# Patient Record
Sex: Male | Born: 1985 | Race: White | Hispanic: No | Marital: Married | Smoking: Current every day smoker
Health system: Southern US, Community
[De-identification: ages and names within clinical notes are randomized; demographics above are authoritative.]

---
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
## 2003-07-23 ENCOUNTER — Emergency Department (HOSPITAL_COMMUNITY): Admission: EM | Admit: 2003-07-23 | Discharge: 2003-07-23 | Payer: Self-pay | Admitting: Emergency Medicine

## 2004-01-07 ENCOUNTER — Emergency Department (HOSPITAL_COMMUNITY): Admission: EM | Admit: 2004-01-07 | Discharge: 2004-01-07 | Payer: Self-pay | Admitting: Emergency Medicine

## 2005-01-12 ENCOUNTER — Emergency Department (HOSPITAL_COMMUNITY): Admission: EM | Admit: 2005-01-12 | Discharge: 2005-01-12 | Payer: Self-pay | Admitting: Emergency Medicine

## 2008-09-27 ENCOUNTER — Emergency Department (HOSPITAL_BASED_OUTPATIENT_CLINIC_OR_DEPARTMENT_OTHER): Admission: EM | Admit: 2008-09-27 | Discharge: 2008-09-27 | Payer: Self-pay | Admitting: Emergency Medicine

## 2009-06-03 ENCOUNTER — Encounter: Admission: RE | Admit: 2009-06-03 | Discharge: 2009-06-03 | Payer: Self-pay | Admitting: Occupational Medicine

## 2009-07-17 ENCOUNTER — Emergency Department (HOSPITAL_COMMUNITY): Admission: EM | Admit: 2009-07-17 | Discharge: 2009-07-17 | Payer: Self-pay | Admitting: Emergency Medicine

## 2009-10-17 ENCOUNTER — Ambulatory Visit: Payer: Self-pay | Admitting: Diagnostic Radiology

## 2009-10-17 ENCOUNTER — Emergency Department (HOSPITAL_BASED_OUTPATIENT_CLINIC_OR_DEPARTMENT_OTHER)
Admission: EM | Admit: 2009-10-17 | Discharge: 2009-10-17 | Payer: Self-pay | Source: Home / Self Care | Admitting: Emergency Medicine

## 2010-11-24 ENCOUNTER — Emergency Department (HOSPITAL_COMMUNITY)
Admission: EM | Admit: 2010-11-24 | Discharge: 2010-11-24 | Disposition: A | Discharge status: Home / Self Care | Payer: BC Managed Care – PPO | Attending: Emergency Medicine | Admitting: Emergency Medicine

## 2010-11-24 DIAGNOSIS — R000 Tachycardia, unspecified: Secondary | ICD-10-CM | POA: Insufficient documentation

## 2010-11-24 DIAGNOSIS — F411 Generalized anxiety disorder: Secondary | ICD-10-CM | POA: Insufficient documentation

## 2010-11-24 DIAGNOSIS — F341 Dysthymic disorder: Secondary | ICD-10-CM | POA: Insufficient documentation

## 2010-11-24 DIAGNOSIS — R064 Hyperventilation: Secondary | ICD-10-CM | POA: Insufficient documentation

## 2010-11-24 DIAGNOSIS — Z79899 Other long term (current) drug therapy: Secondary | ICD-10-CM | POA: Insufficient documentation

## 2010-11-24 DIAGNOSIS — R079 Chest pain, unspecified: Secondary | ICD-10-CM | POA: Insufficient documentation

## 2010-11-24 DIAGNOSIS — R002 Palpitations: Secondary | ICD-10-CM | POA: Insufficient documentation

## 2010-11-24 DIAGNOSIS — R209 Unspecified disturbances of skin sensation: Secondary | ICD-10-CM | POA: Insufficient documentation

## 2010-11-24 DIAGNOSIS — F410 Panic disorder [episodic paroxysmal anxiety] without agoraphobia: Secondary | ICD-10-CM | POA: Insufficient documentation

## 2010-12-04 ENCOUNTER — Emergency Department (HOSPITAL_COMMUNITY)
Admission: EM | Admit: 2010-12-04 | Discharge: 2010-12-04 | Disposition: A | Discharge status: Home / Self Care | Payer: BC Managed Care – PPO | Attending: Emergency Medicine | Admitting: Emergency Medicine

## 2010-12-04 DIAGNOSIS — F411 Generalized anxiety disorder: Secondary | ICD-10-CM | POA: Insufficient documentation

## 2010-12-04 DIAGNOSIS — F410 Panic disorder [episodic paroxysmal anxiety] without agoraphobia: Secondary | ICD-10-CM | POA: Insufficient documentation

## 2010-12-04 DIAGNOSIS — Z79899 Other long term (current) drug therapy: Secondary | ICD-10-CM | POA: Insufficient documentation

## 2010-12-04 DIAGNOSIS — F3289 Other specified depressive episodes: Secondary | ICD-10-CM | POA: Insufficient documentation

## 2010-12-04 DIAGNOSIS — F329 Major depressive disorder, single episode, unspecified: Secondary | ICD-10-CM | POA: Insufficient documentation

## 2010-12-04 DIAGNOSIS — R209 Unspecified disturbances of skin sensation: Secondary | ICD-10-CM | POA: Insufficient documentation

## 2013-08-12 ENCOUNTER — Emergency Department (HOSPITAL_COMMUNITY)
Admission: EM | Admit: 2013-08-12 | Discharge: 2013-08-12 | Disposition: A | Discharge status: Home / Self Care | Payer: BC Managed Care – PPO | Attending: Emergency Medicine | Admitting: Emergency Medicine

## 2013-08-12 ENCOUNTER — Encounter (HOSPITAL_COMMUNITY): Payer: Self-pay | Admitting: Emergency Medicine

## 2013-08-12 ENCOUNTER — Emergency Department (HOSPITAL_COMMUNITY): Payer: BC Managed Care – PPO

## 2013-08-12 DIAGNOSIS — Y929 Unspecified place or not applicable: Secondary | ICD-10-CM | POA: Insufficient documentation

## 2013-08-12 DIAGNOSIS — M940 Chondrocostal junction syndrome [Tietze]: Secondary | ICD-10-CM | POA: Insufficient documentation

## 2013-08-12 DIAGNOSIS — Y9389 Activity, other specified: Secondary | ICD-10-CM | POA: Insufficient documentation

## 2013-08-12 DIAGNOSIS — Z880 Allergy status to penicillin: Secondary | ICD-10-CM | POA: Insufficient documentation

## 2013-08-12 DIAGNOSIS — F17200 Nicotine dependence, unspecified, uncomplicated: Secondary | ICD-10-CM | POA: Insufficient documentation

## 2013-08-12 DIAGNOSIS — IMO0002 Reserved for concepts with insufficient information to code with codable children: Secondary | ICD-10-CM | POA: Insufficient documentation

## 2013-08-12 MED ORDER — OXYCODONE-ACETAMINOPHEN 5-325 MG PO TABS
2.0000 | ORAL_TABLET | Freq: Once | ORAL | Status: AC
  Administered 2013-08-12: 2 via ORAL
  Filled 2013-08-12: qty 2

## 2013-08-12 MED ORDER — TRAMADOL HCL 50 MG PO TABS: 50.0000 mg | ORAL_TABLET | Freq: Four times a day (QID) | ORAL | Status: AC | PRN

## 2013-08-12 NOTE — Discharge Instructions (Signed)
Costochondritis Costochondritis, sometimes called Tietze syndrome, is a swelling and irritation (inflammation) of the tissue (cartilage) that connects your ribs with your breastbone (sternum). It causes pain in the chest and rib area. Costochondritis usually goes away on its own over time. It can take up to 6 weeks or longer to get better, especially if you are unable to limit your activities. CAUSES  Some cases of costochondritis have no known cause. Possible causes include:  Injury (trauma).  Exercise or activity such as lifting.  Severe coughing. SIGNS AND SYMPTOMS  Pain and tenderness in the chest and rib area.  Pain that gets worse when coughing or taking deep breaths.  Pain that gets worse with specific movements. DIAGNOSIS  Your health care provider will do a physical exam and ask about your symptoms. Chest X-rays or other tests may be done to rule out other problems. TREATMENT  Costochondritis usually goes away on its own over time. Your health care provider may prescribe medicine to help relieve pain. HOME CARE INSTRUCTIONS   Avoid exhausting physical activity. Try not to strain your ribs during normal activity. This would include any activities using chest, abdominal, and side muscles, especially if heavy weights are used.  Apply ice to the affected area for the first 2 days after the pain begins.  Put ice in a plastic bag.  Place a towel between your skin and the bag.  Leave the ice on for 20 minutes, 2-3 times a day.  Only take over-the-counter or prescription medicines as directed by your health care provider. SEEK MEDICAL CARE IF:  You have redness or swelling at the rib joints. These are signs of infection.  Your pain does not go away despite rest or medicine. SEEK IMMEDIATE MEDICAL CARE IF:   Your pain increases or you are very uncomfortable.  You have shortness of breath or difficulty breathing.  You cough up blood.  You have worse chest pains,  sweating, or vomiting.  You have a fever or persistent symptoms for more than 2-3 days.  You have a fever and your symptoms suddenly get worse. MAKE SURE YOU:   Understand these instructions.  Will watch your condition.  Will get help right away if you are not doing well or get worse. Document Released: 11/15/2004 Document Revised: 11/26/2012 Document Reviewed: 09/09/2012 Exit[employer] Patient Information 2015 [employer]. This information is not intended to replace advice given to you by your health care provider. Make sure you discuss any questions you have with your health care provider.  

## 2013-08-12 NOTE — ED Notes (Signed)
Patient is alert and oriented x3.  He was given DC instructions and follow up visit instructions.  Patient gave verbal understanding.  He was DC ambulatory under his own power to home.  V/S stable.  He was not showing any signs of distress on DC 

## 2013-08-12 NOTE — ED Notes (Signed)
Per pt report: Pt was playing with daughter on Sunday when his daughter jumped on pt's right upper chest.  Pt heard a "pop" and has been trying to deal with the pain.  Pt reports it feels like someone is stabbing into him.  No obvious deformity or crepitus noted.  Area feels softer upon palpation and pt reports increasing pain.  Pt a/o x 4.  Pt ambulatory.

## 2013-08-12 NOTE — ED Provider Notes (Signed)
CSN: [id number]     Arrival date & time 08/12/13  0507 History   First MD Initiated Contact with Patient 08/12/13 0602     Chief Complaint  Patient presents with  . Rib Injury     (Consider location/radiation/quality/duration/timing/severity/associated sxs/prior Treatment) Patient is a 28 y.o. male presenting with chest pain. The history is provided by the patient. No language interpreter was used.  Chest Pain Pain location:  R chest Pain quality: sharp   Pain radiates to:  Does not radiate Pain radiates to the back: no   Pain severity:  Moderate Onset quality:  Gradual Duration:  2 days Timing:  Intermittent Progression:  Unchanged Chronicity:  New Context: breathing and movement   Context: no stress   Relieved by:  Nothing Worsened by:  Deep breathing and movement Ineffective treatments:  Aspirin Associated symptoms: no abdominal pain, no cough, no fever, no numbness and no shortness of breath     28 year old male presents complaining of rib injury.  History reviewed. No pertinent past medical history. No past surgical history on file. No family history on file. History  Substance Use Topics  . Smoking status: Current Every Day Smoker -- 1.00 packs/day    Types: Cigarettes  . Smokeless tobacco: Not on file  . Alcohol Use: No    Review of Systems  Constitutional: Negative for fever.  Respiratory: Negative for cough and shortness of breath.   Cardiovascular: Positive for chest pain.  Gastrointestinal: Negative for abdominal pain.  Skin: Negative for rash and wound.  Neurological: Negative for numbness.      Allergies  Amoxicillin  Home Medications   Prior to Admission medications   Medication Sig Start Date End Date Taking? Authorizing Provider  naproxen sodium (ANAPROX) 220 MG tablet Take 440 mg by mouth 2 (two) times daily as needed (pain).   Yes Historical Provider, MD   BP 160/95  Pulse 85  Temp(Src) 98.9 F (37.2 C) (Oral)  Resp 25  SpO2  100% Physical Exam  Constitutional: He appears well-developed and well-nourished. No distress.  HENT:  Head: Atraumatic.  Eyes: Conjunctivae are normal.  Neck: Normal range of motion. Neck supple.  Cardiovascular: Normal rate and regular rhythm.   Pulmonary/Chest: Effort normal. No respiratory distress. He has no wheezes. He has no rales. He exhibits tenderness (point tenderness to R mid anterior rib without crepitus, emphysema, or rash.  ).  Abdominal: There is no tenderness.  Neurological: He is alert.  Skin: No rash noted.  Psychiatric: He has a normal mood and affect.    ED Course  Procedures (including critical care time)  6:21 AM R rib pain, xray neg for acute rib fx or pneumothorax.  Is afebrile, vss, no hypoxia.  Suspect costochondral pain.  Plan to have pt f/u with PCP for further care.  RICE therapy discussed.  Work note and pain medication provided.    Labs Review Labs Reviewed - No data to display  Imaging Review Dg Chest 2 View  08/12/2013   CLINICAL DATA:  Injury to the right anterior chest 4 days ago. Pain, shortness of breath, and right anterior chest pain. Smoker.  EXAM: CHEST  2 VIEW  COMPARISON:  07/17/2009  FINDINGS: The heart size and mediastinal contours are within normal limits. Both lungs are clear. The visualized skeletal structures are unremarkable.  IMPRESSION: No active cardiopulmonary disease.   Electronically Signed   By: Burman NievesWilliam  Stevens M.D.   On: 08/12/2013 06:03     EKG Interpretation None  MDM   Final diagnoses:  Costochondritis, acute    BP 160/95  Pulse 85  Temp(Src) 98.9 F (37.2 C) (Oral)  Resp 25  SpO2 100%  I have reviewed nursing notes and vital signs. I personally reviewed the imaging tests through PACS system  I reviewed available ER/hospitalization records thought the EMR     Fayrene HelperBowie Tran, [place] JerseyPA-C 08/12/13 [id number]

## 2013-08-13 NOTE — ED Provider Notes (Signed)
Medical screening examination/treatment/procedure(s) were performed by non-physician practitioner and as supervising physician I was immediately available for consultation/collaboration.   EKG Interpretation None       Olga M Otter, MD 08/13/13 0605 

## 2013-08-14 ENCOUNTER — Emergency Department (HOSPITAL_COMMUNITY): Payer: BC Managed Care – PPO

## 2013-08-14 ENCOUNTER — Encounter (HOSPITAL_COMMUNITY): Payer: Self-pay | Admitting: Emergency Medicine

## 2013-08-14 ENCOUNTER — Emergency Department (HOSPITAL_COMMUNITY)
Admission: EM | Admit: 2013-08-14 | Discharge: 2013-08-14 | Disposition: A | Discharge status: Home / Self Care | Payer: BC Managed Care – PPO | Attending: Emergency Medicine | Admitting: Emergency Medicine

## 2013-08-14 DIAGNOSIS — G8911 Acute pain due to trauma: Secondary | ICD-10-CM | POA: Insufficient documentation

## 2013-08-14 DIAGNOSIS — S299XXD Unspecified injury of thorax, subsequent encounter: Secondary | ICD-10-CM

## 2013-08-14 DIAGNOSIS — F17200 Nicotine dependence, unspecified, uncomplicated: Secondary | ICD-10-CM | POA: Insufficient documentation

## 2013-08-14 DIAGNOSIS — R079 Chest pain, unspecified: Secondary | ICD-10-CM | POA: Insufficient documentation

## 2013-08-14 DIAGNOSIS — Z880 Allergy status to penicillin: Secondary | ICD-10-CM | POA: Insufficient documentation

## 2013-08-14 MED ORDER — OXYCODONE-ACETAMINOPHEN 7.5-325 MG PO TABS: 1.0000 | ORAL_TABLET | ORAL | Status: DC | PRN

## 2013-08-14 NOTE — ED Notes (Signed)
Bed: WA20 Expected date:  Expected time:  Means of arrival:  Comments: EMS-rib fractures

## 2013-08-14 NOTE — ED Notes (Addendum)
Per EMS pt seen here Wednesday diagnosed with right rib fractures. Pt complaint of worsening pain related to overexertion at work. Pt reports pain in same location as Wednesday but worsening. Pt denies CP. Pt denies LOC or fall post diagnoses.   Pt given 100 mcg of fentanyl en route with EMS.   Upon review of chart pt was diagnosed with costochondritis NOT fractures.

## 2013-08-14 NOTE — Discharge Instructions (Signed)
No lifting or pulling as directed.

## 2013-08-14 NOTE — ED Provider Notes (Signed)
CSN: [id number]     Arrival date & time 08/14/13  1346 History   First MD Initiated Contact with Patient 08/14/13 1451     Chief Complaint  Patient presents with  . Rib Injury      HPI Patient Noah Williams 2 days ago with anterior chest wall injury when his young child was jumping on his chest and felt a pop.  He had x-rays at that time with her negative.  He still hears and feels a pop when he moves about.  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 No pertinent past medical history. History reviewed. No pertinent past surgical history. No family history on file. History  Substance Use Topics  . Smoking status: Current Every Day Smoker -- 1.00 packs/day    Types: Cigarettes  . Smokeless tobacco: Not on file  . Alcohol Use: No    Review of Systems  All other systems reviewed and are negative.     Allergies  Amoxicillin  Home Medications   Prior to Admission medications   Medication Sig Start Date End Date Taking? Authorizing Provider  naproxen sodium (ANAPROX) 220 MG tablet Take 440 mg by mouth 2 (two) times daily as needed (pain).    Historical Provider, MD  oxyCODONE-acetaminophen (PERCOCET) 7.5-325 MG per tablet Take 1 tablet by mouth every 4 (four) hours as needed for pain. 08/14/13   Nelia Shiobert L Devere Brem, MD  traMADol (ULTRAM) 50 MG tablet Take 1 tablet (50 mg total) by mouth every 6 (six) hours as needed for moderate pain. 08/12/13   Fayrene HelperBowie Tran, PA-C   BP 147/87  Pulse 70  Temp(Src) 97.7 F (36.5 C) (Oral)  Resp 20  SpO2 100% Physical Exam  Nursing note and vitals reviewed. Constitutional: He is oriented to person, place, and time. He appears well-developed and well-nourished. No distress.  HENT:  Head: Normocephalic and atraumatic.  Eyes: Pupils are equal, round, and reactive to light.  Neck: Normal range of motion.  Cardiovascular: Normal rate and intact distal pulses.   Pulmonary/Chest: No respiratory distress.    Pain reproduces to palpation    Abdominal: Normal appearance. He exhibits no distension.  Musculoskeletal: Normal range of motion.  Neurological: He is alert and oriented to person, place, and time. No cranial nerve deficit.  Skin: Skin is warm and dry. No rash noted.  Psychiatric: He has a normal mood and affect. His behavior is normal.    ED Course  Procedures (including critical care time) Labs Review Labs Reviewed - No data to display  Imaging Review Dg Ribs Unilateral W/chest Right  08/14/2013   CLINICAL DATA:  Injury 5 days ago with right-sided pain and shortness of breath  EXAM: RIGHT RIBS AND CHEST - 3+ VIEW  COMPARISON:  08/12/2013  FINDINGS: Heart size is normal. Mediastinal shadows are normal. The lungs are clear. No pneumothorax or hemothorax. No visible rib fracture.  IMPRESSION: Normal radiographs.   Electronically Signed   By: Paulina FusiMark  Shogry M.D.   On: 08/14/2013 14:26   Dg Chest Portable 1 View  08/14/2013   CLINICAL DATA:  RIB INJURY  EXAM: PORTABLE CHEST - 1 VIEW  COMPARISON:  Rib series 08/14/2013, two-view chest 08/12/2013  FINDINGS: The heart size and mediastinal contours are within normal limits. Both lungs are clear. The visualized skeletal structures are unremarkable.  IMPRESSION: No active disease.   Electronically Signed   By: Salome HolmesHector  Cooper M.D.   On: 08/14/2013 15:17      MDM  Final diagnoses:  Chest wall injury, subsequent encounter        Nelia Shiobert L Keani Gotcher, MD 08/14/13 1540

## 2013-10-13 ENCOUNTER — Ambulatory Visit: Payer: BC Managed Care – PPO | Admitting: Family Medicine

## 2015-01-24 ENCOUNTER — Encounter (HOSPITAL_COMMUNITY): Payer: Self-pay | Admitting: *Deleted

## 2015-01-24 ENCOUNTER — Emergency Department (HOSPITAL_COMMUNITY): Payer: BLUE CROSS/BLUE SHIELD

## 2015-01-24 ENCOUNTER — Emergency Department (HOSPITAL_COMMUNITY)
Admission: EM | Admit: 2015-01-24 | Discharge: 2015-01-24 | Disposition: A | Discharge status: Home / Self Care | Payer: BLUE CROSS/BLUE SHIELD | Attending: Emergency Medicine | Admitting: Emergency Medicine

## 2015-01-24 DIAGNOSIS — S4991XA Unspecified injury of right shoulder and upper arm, initial encounter: Secondary | ICD-10-CM | POA: Diagnosis not present

## 2015-01-24 DIAGNOSIS — Y998 Other external cause status: Secondary | ICD-10-CM | POA: Insufficient documentation

## 2015-01-24 DIAGNOSIS — Y92000 Kitchen of unspecified non-institutional (private) residence as  the place of occurrence of the external cause: Secondary | ICD-10-CM | POA: Diagnosis not present

## 2015-01-24 DIAGNOSIS — W010XXA Fall on same level from slipping, tripping and stumbling without subsequent striking against object, initial encounter: Secondary | ICD-10-CM | POA: Insufficient documentation

## 2015-01-24 DIAGNOSIS — Z880 Allergy status to penicillin: Secondary | ICD-10-CM | POA: Diagnosis not present

## 2015-01-24 DIAGNOSIS — F17210 Nicotine dependence, cigarettes, uncomplicated: Secondary | ICD-10-CM | POA: Diagnosis not present

## 2015-01-24 DIAGNOSIS — M25511 Pain in right shoulder: Secondary | ICD-10-CM

## 2015-01-24 DIAGNOSIS — Y9389 Activity, other specified: Secondary | ICD-10-CM | POA: Diagnosis not present

## 2015-01-24 NOTE — ED Provider Notes (Signed)
CSN: [id number]     Arrival date & time 01/24/15  1749 History  By signing my name below, I, Gonzella Lex, attest that this documentation has been prepared under the direction and in the presence of [employer], PA-C. Electronically Signed: Gonzella Lex, Scribe. 01/24/2015. 6:37 PM.   Chief Complaint  Patient presents with  . Shoulder Injury    The history is provided by the patient. No language interpreter was used.     HPI Comments: Noah Williams is a 29 y.o. male who presents to the Emergency Department complaining of constant, 6/10 right shoulder pain, "popping", and tingling in his elbow and fingers after slipping in the kitchen and grabbing the countertop earlier today. He notes his pain is worsened with extension/raising of his shoulder. He reports taking ibuprofen with little relief. Pt notes a h/o previous shoulder injuries. He has no other complaints at this time.   No past medical history on file. No past surgical history on file. No family history on file. Social History  Substance Use Topics  . Smoking status: Current Every Day Smoker -- 1.00 packs/day    Types: Cigarettes  . Smokeless tobacco: None  . Alcohol Use: No    Review of Systems  All other systems reviewed and are negative.   Allergies  Amoxicillin  Home Medications   Prior to Admission medications   Medication Sig Start Date End Date Taking? Authorizing Provider  naproxen sodium (ANAPROX) 220 MG tablet Take 440 mg by mouth 2 (two) times daily as needed (pain).    Historical Provider, MD  oxyCODONE-acetaminophen (PERCOCET) 7.5-325 MG per tablet Take 1 tablet by mouth every 4 (four) hours as needed for pain. 08/14/13   Nelva Nay, MD  traMADol (ULTRAM) 50 MG tablet Take 1 tablet (50 mg total) by mouth every 6 (six) hours as needed for moderate pain. 08/12/13   Fayrene Helper, PA-C   BP 120/86 mmHg  Pulse 68  Temp(Src) 97.6 F (36.4 C) (Oral)  Resp 18  Ht  (1.88 m)  Wt  129.275 kg  BMI 36.58 kg/m2  SpO2 98%   Physical Exam  Constitutional: He is oriented to person, place, and time. He appears well-developed and well-nourished. No distress.  HENT:  Head: Normocephalic.  Eyes: Conjunctivae are normal.  Cardiovascular: Regular rhythm and normal heart sounds.   Pulmonary/Chest: Effort normal and breath sounds normal.  Abdominal: He exhibits no distension.  Musculoskeletal:  Shoulders equal bilaterally No obvious trauma Tenderness to palpation of anterior lateral right shoulder Sensation strength cap refill intact  Grip strength 5/5 Radial pulse 2+ Reduced ROM with both passive and active due to pain  20 degrees of forward flexion until onset of pain    Neurological: He is alert and oriented to person, place, and time.  Skin: Skin is warm and dry.  Psychiatric: He has a normal mood and affect.  Nursing note and vitals reviewed.   ED Course  Procedures  DIAGNOSTIC STUDIES:    Oxygen Saturation is 100% on RA, normal by my interpretation.   COORDINATION OF CARE:  6:24 PM Will order x-ray of pt's right shoulder. Will give pt a sling to wear until orthopedic follow-up. Will give pt a print-out of shoulder exercises to do. Discussed treatment plan with pt at bedside and pt agreed to plan.   Imaging Review Dg Shoulder Right  01/24/2015  CLINICAL DATA:  Recent slip and fall with right shoulder pain, initial encounter EXAM: RIGHT SHOULDER - 2+ VIEW  COMPARISON:  08/14/2013 FINDINGS: There are changes consistent with a healed fracture of the midshaft of the right clavicle. No acute fracture or dislocation is noted. The underlying bony thorax is within normal limits. IMPRESSION: No acute abnormality noted. Electronically Signed   By: Alcide CleverMark  Lukens M.D.   On: 01/24/2015 19:02   I have personally reviewed and evaluated these images and lab results as part of my medical decision-making.  MDM   Final diagnoses:  Right shoulder pain    Labs:  None   Imaging: X-ray of right shoulder  Consults: Advise pt to follow-up with orthopedist   Therapeutics: Will give pt sling to wear until orthopedic follow up and will give pt print-out of shoulder exercises to perform.  Discharge Meds:  Assessment/plan: Patient presents with right shoulder pain, no significant findings on diagnostic imaging here. Difficult shoulder exam due to patient compliance. Patient be placed in a sling, instructed follow-up with orthopedics next week if symptoms persist, patient given strict return precautions, verbalized understanding and agreement to today's plan and had no further questions or concerns at the time of discharge  I personally performed the services described in this documentation, which was scribed in my presence. The recorded information has been reviewed and is accurate.      Eyvonne MechanicJeffrey Kamla Skilton, PA-C 01/25/15 [id number]  Pricilla LovelessScott Goldston, MD 01/27/15 [phone]

## 2015-01-24 NOTE — ED Notes (Signed)
Patient placed in gown and ice pack applied to right shoulder

## 2015-01-24 NOTE — ED Notes (Signed)
PT reports he slipped while moping floor and caught his RT shoulder on counter.

## 2015-01-24 NOTE — ED Notes (Signed)
SEE PA assessment 

## 2015-10-17 ENCOUNTER — Emergency Department: Payer: Worker's Compensation

## 2015-10-17 ENCOUNTER — Emergency Department
Admission: EM | Admit: 2015-10-17 | Discharge: 2015-10-17 | Disposition: A | Discharge status: Home / Self Care | Payer: Worker's Compensation | Attending: Emergency Medicine | Admitting: Emergency Medicine

## 2015-10-17 DIAGNOSIS — Y990 Civilian activity done for income or pay: Secondary | ICD-10-CM | POA: Insufficient documentation

## 2015-10-17 DIAGNOSIS — Y939 Activity, unspecified: Secondary | ICD-10-CM | POA: Insufficient documentation

## 2015-10-17 DIAGNOSIS — S62346A Nondisplaced fracture of base of fifth metacarpal bone, right hand, initial encounter for closed fracture: Secondary | ICD-10-CM | POA: Insufficient documentation

## 2015-10-17 DIAGNOSIS — W231XXA Caught, crushed, jammed, or pinched between stationary objects, initial encounter: Secondary | ICD-10-CM | POA: Insufficient documentation

## 2015-10-17 DIAGNOSIS — S61411A Laceration without foreign body of right hand, initial encounter: Secondary | ICD-10-CM

## 2015-10-17 DIAGNOSIS — IMO0002 Reserved for concepts with insufficient information to code with codable children: Secondary | ICD-10-CM

## 2015-10-17 DIAGNOSIS — Y929 Unspecified place or not applicable: Secondary | ICD-10-CM | POA: Insufficient documentation

## 2015-10-17 DIAGNOSIS — S62308A Unspecified fracture of other metacarpal bone, initial encounter for closed fracture: Secondary | ICD-10-CM

## 2015-10-17 DIAGNOSIS — F17210 Nicotine dependence, cigarettes, uncomplicated: Secondary | ICD-10-CM | POA: Diagnosis not present

## 2015-10-17 DIAGNOSIS — S6991XA Unspecified injury of right wrist, hand and finger(s), initial encounter: Secondary | ICD-10-CM | POA: Diagnosis present

## 2015-10-17 MED ORDER — OXYCODONE-ACETAMINOPHEN 5-325 MG PO TABS
2.0000 | ORAL_TABLET | Freq: Once | ORAL | Status: AC
  Administered 2015-10-17: 2 via ORAL
  Filled 2015-10-17: qty 2

## 2015-10-17 MED ORDER — OXYCODONE-ACETAMINOPHEN 7.5-325 MG PO TABS: 1.0000 | ORAL_TABLET | ORAL | 0 refills | Status: DC | PRN

## 2015-10-17 NOTE — ED Provider Notes (Signed)
[facility] Emergency Department Provider Note   ____________________________________________   None    (approximate)  I have reviewed the triage vital signs and the nursing notes.   HISTORY  Chief Complaint Hand Injury    HPI Noah Williams is a 30 y.o. male patient complain of right hand pain secondary to a crush incident. Patient state hand got stuck in a hydraulic lift. Instead occurred at work prior to arrival.Patient also has a laceration to the palmar aspect of the right hand. Patient is rating his pain as a 10 over 10. Except for pressure dressing to the palliative measures for this complaint. Patient state he is unable to extend his fifth digit after the incident.   History reviewed. No pertinent past medical history.  There are no active problems to display for this patient.   History reviewed. No pertinent surgical history.  Prior to Admission medications   Medication Sig Start Date End Date Taking? Authorizing Provider  naproxen sodium (ANAPROX) 220 MG tablet Take 440 mg by mouth 2 (two) times daily as needed (pain).    Historical Provider, MD  oxyCODONE-acetaminophen (PERCOCET) 7.5-325 MG per tablet Take 1 tablet by mouth every 4 (four) hours as needed for pain. 08/14/13   Nelva Nayobert Beaton, MD  traMADol (ULTRAM) 50 MG tablet Take 1 tablet (50 mg total) by mouth every 6 (six) hours as needed for moderate pain. 08/12/13   Fayrene HelperBowie Tran, PA-C    Allergies Amoxicillin  No family history on file.  Social History Social History  Substance Use Topics  . Smoking status: Current Every Day Smoker    Packs/day: 1.00    Types: Cigarettes  . Smokeless tobacco: Never Used  . Alcohol use No    Review of Systems Constitutional: No fever/chills Eyes: No visual changes. ENT: No sore throat. Cardiovascular: Denies chest pain. Respiratory: Denies shortness of breath. Gastrointestinal: No abdominal pain.  No nausea, no vomiting.  No diarrhea.  No  constipation. Genitourinary: Negative for dysuria. Musculoskeletal: Negative for back pain. Skin: Negative for rash. Neurological: Negative for headaches, focal weakness or numbness.    ____________________________________________   PHYSICAL EXAM:  VITAL SIGNS: ED Triage Vitals  Enc Vitals Group     BP 10/17/15 1418 (!) 134/94     Pulse Rate 10/17/15 1418 75     Resp 10/17/15 1418 20     Temp 10/17/15 1418 97.7 F (36.5 C)     Temp src --      SpO2 10/17/15 1418 98 %     Weight 10/17/15 1416 245 lb (111.1 kg)     Height 10/17/15 1416 6\' 2"  (1.88 m)     Head Circumference --      Peak Flow --      Pain Score 10/17/15 1416 10     Pain Loc --      Pain Edu? --      Excl. in GC? --     Constitutional: Alert and oriented. Well appearing and in no acute distress. Anxious Eyes: Conjunctivae are normal. PERRL. EOMI. Head: Atraumatic. Nose: No congestion/rhinnorhea. Mouth/Throat: Mucous membranes are moist.  Oropharynx non-erythematous. Neck: No stridor.  No cervical spine tenderness to palpation. Hematological/Lymphatic/Immunilogical: No cervical lymphadenopathy. Cardiovascular: Normal rate, regular rhythm. Grossly normal heart sounds.  Good peripheral circulation. Respiratory: Normal respiratory effort.  No retractions. Lungs CTAB. Gastrointestinal: Soft and nontender. No distention. No abdominal bruits. No CVA tenderness. Musculoskeletal: Obvious edema to right hand. No active extension of the mid and distal  phalange. Neurologic:  Normal speech and language. No gross focal neurologic deficits are appreciated. No gait instability. Skin:  Skin is warm, dry and intact. No rash noted. Laceration palmar aspect the right hand Psychiatric: Mood and affect are normal. Speech and behavior are normal.  ____________________________________________   LABS (all labs ordered are listed, but only abnormal results are displayed)  Labs Reviewed - No data to  display ____________________________________________  EKG   ____________________________________________  RADIOLOGY  Right fifth metacarpal head fracture ____________________________________________   PROCEDURES  Procedure(s) performed: LACERATION REPAIR Performed by: Joni Reining Authorized by: Joni Reining Consent: Verbal consent obtained. Risks and benefits: risks, benefits and alternatives were discussed Consent given by: patient Patient identity confirmed: provided demographic data Prepped and Draped in normal sterile fashion Wound explored  Laceration Location: Palmer right hand  Laceration Length: A centimeters cm  No Foreign Bodies seen or palpated  Anesthesia: local infiltration  Local anesthetic: lidocaine 1% without epinephrine  Anesthetic total: 6 ml  Irrigation method: syringe Amount of cleaning: standard  Skin closure: 3-0 nylon  Number of sutures: 12 Technique: Interrupted Patient tolerance: Patient tolerated the procedure well with no immediate complications. Patient continues to be unable to actively extend the mid and distal phalange of the fifth digit right hand.  Procedures  Critical Care performed: No  ____________________________________________   INITIAL IMPRESSION / ASSESSMENT AND PLAN / ED COURSE  Pertinent labs & imaging results that were available during my care of the patient were reviewed by me and considered in my medical decision making (see chart for details).  Right fifth metacarpal head fracture and laceration to the right palm. Patient also has decreased extension of the mid and distal phalange fifth digit right hand. Patient given discharge care instructions. Patient given prescription for Percocets. Patient advised, the orthopedic department in the morning for follow-up care.  Clinical Course  Wound was cleaned and sutured. Patient placed N/A on a gutter splint and advised to follow-up with orthopedics secondary to  suspected extension tendon injury.   ____________________________________________   FINAL CLINICAL IMPRESSION(S) / ED DIAGNOSES  Final diagnoses:  Closed fracture of 5th metacarpal, initial encounter  Hand laceration, right, initial encounter  Tendon injury      NEW MEDICATIONS STARTED DURING THIS VISIT:  New Prescriptions   No medications on file     Note:  This document was prepared using Dragon voice recognition software and may include unintentional dictation errors.    Joni Reining, PA-C 10/17/15 1600    Charlynne Pander, MD 10/17/15 [phone]

## 2015-10-17 NOTE — ED Triage Notes (Signed)
Pt states he was at work and got a lift stuck on his R hand, presents with land laceration and deformity noted.

## 2018-03-29 ENCOUNTER — Emergency Department (HOSPITAL_COMMUNITY): Payer: Self-pay

## 2018-03-29 ENCOUNTER — Emergency Department (HOSPITAL_COMMUNITY)
Admission: EM | Admit: 2018-03-29 | Discharge: 2018-03-29 | Disposition: A | Discharge status: Home / Self Care | Payer: Self-pay | Attending: Emergency Medicine | Admitting: Emergency Medicine

## 2018-03-29 ENCOUNTER — Encounter (HOSPITAL_COMMUNITY): Payer: Self-pay | Admitting: Emergency Medicine

## 2018-03-29 ENCOUNTER — Other Ambulatory Visit: Payer: Self-pay

## 2018-03-29 DIAGNOSIS — S93401A Sprain of unspecified ligament of right ankle, initial encounter: Secondary | ICD-10-CM | POA: Insufficient documentation

## 2018-03-29 DIAGNOSIS — Z79899 Other long term (current) drug therapy: Secondary | ICD-10-CM | POA: Insufficient documentation

## 2018-03-29 DIAGNOSIS — Y999 Unspecified external cause status: Secondary | ICD-10-CM | POA: Insufficient documentation

## 2018-03-29 DIAGNOSIS — X58XXXA Exposure to other specified factors, initial encounter: Secondary | ICD-10-CM | POA: Insufficient documentation

## 2018-03-29 DIAGNOSIS — R55 Syncope and collapse: Secondary | ICD-10-CM | POA: Insufficient documentation

## 2018-03-29 DIAGNOSIS — Y939 Activity, unspecified: Secondary | ICD-10-CM | POA: Insufficient documentation

## 2018-03-29 DIAGNOSIS — F17210 Nicotine dependence, cigarettes, uncomplicated: Secondary | ICD-10-CM | POA: Insufficient documentation

## 2018-03-29 DIAGNOSIS — Y929 Unspecified place or not applicable: Secondary | ICD-10-CM | POA: Insufficient documentation

## 2018-03-29 LAB — CBC WITH DIFFERENTIAL/PLATELET
Abs Immature Granulocytes: 0.04 10*3/uL (ref 0.00–0.07)
BASOS ABS: 0.1 10*3/uL (ref 0.0–0.1)
Basophils Relative: 1 %
EOS ABS: 0.3 10*3/uL (ref 0.0–0.5)
Eosinophils Relative: 3 %
HEMATOCRIT: 44.4 % (ref 39.0–52.0)
Hemoglobin: 14.2 g/dL (ref 13.0–17.0)
IMMATURE GRANULOCYTES: 0 %
LYMPHS ABS: 2.9 10*3/uL (ref 0.7–4.0)
LYMPHS PCT: 26 %
MCH: 29.2 pg (ref 26.0–34.0)
MCHC: 32 g/dL (ref 30.0–36.0)
MCV: 91.2 fL (ref 80.0–100.0)
Monocytes Absolute: 1.2 10*3/uL — ABNORMAL HIGH (ref 0.1–1.0)
Monocytes Relative: 10 %
NEUTROS PCT: 60 %
NRBC: 0 % (ref 0.0–0.2)
Neutro Abs: 6.8 10*3/uL (ref 1.7–7.7)
PLATELETS: 287 10*3/uL (ref 150–400)
RBC: 4.87 MIL/uL (ref 4.22–5.81)
RDW: 12.8 % (ref 11.5–15.5)
WBC: 11.3 10*3/uL — AB (ref 4.0–10.5)

## 2018-03-29 LAB — COMPREHENSIVE METABOLIC PANEL
ALBUMIN: 3.5 g/dL (ref 3.5–5.0)
ALT: 16 U/L (ref 0–44)
AST: 16 U/L (ref 15–41)
Alkaline Phosphatase: 63 U/L (ref 38–126)
Anion gap: 9 (ref 5–15)
BUN: 7 mg/dL (ref 6–20)
CHLORIDE: 110 mmol/L (ref 98–111)
CO2: 21 mmol/L — AB (ref 22–32)
Calcium: 8.9 mg/dL (ref 8.9–10.3)
Creatinine, Ser: 1.01 mg/dL (ref 0.61–1.24)
GFR calc Af Amer: >60 mL/min (ref >60)
GFR calc non Af Amer: >60 mL/min (ref >60)
GLUCOSE: 104 mg/dL — AB (ref 70–99)
Potassium: 3.7 mmol/L (ref 3.5–5.1)
SODIUM: 140 mmol/L (ref 135–145)
TOTAL PROTEIN: 6.4 g/dL — AB (ref 6.5–8.1)
Total Bilirubin: 0.5 mg/dL (ref 0.3–1.2)

## 2018-03-29 LAB — URINALYSIS, ROUTINE W REFLEX MICROSCOPIC
Bilirubin Urine: NEGATIVE
Glucose, UA: NEGATIVE mg/dL
Hgb urine dipstick: NEGATIVE
KETONES UR: NEGATIVE mg/dL
Leukocytes, UA: NEGATIVE
NITRITE: NEGATIVE
PH: 5 (ref 5.0–8.0)
Protein, ur: NEGATIVE mg/dL
Specific Gravity, Urine: 1.026 (ref 1.005–1.030)

## 2018-03-29 LAB — TROPONIN I: Troponin I: <0.03 ng/mL (ref <0.03)

## 2018-03-29 LAB — CBG MONITORING, ED: Glucose-Capillary: 100 mg/dL — ABNORMAL HIGH (ref 70–99)

## 2018-03-29 MED ORDER — OXYCODONE-ACETAMINOPHEN 5-325 MG PO TABS: 1.0000 | ORAL_TABLET | ORAL | 0 refills | Status: AC | PRN

## 2018-03-29 MED ORDER — MORPHINE SULFATE (PF) 4 MG/ML IV SOLN
4.0000 mg | Freq: Once | INTRAVENOUS | Status: AC
  Administered 2018-03-29: 4 mg via INTRAVENOUS
  Filled 2018-03-29: qty 1

## 2018-03-29 MED ORDER — SODIUM CHLORIDE 0.9 % IV BOLUS
1000.0000 mL | Freq: Once | INTRAVENOUS | Status: AC
  Administered 2018-03-29: 1000 mL via INTRAVENOUS

## 2018-03-29 MED ORDER — SODIUM CHLORIDE 0.9 % IV SOLN: INTRAVENOUS | Status: DC

## 2018-03-29 MED ORDER — ONDANSETRON HCL 4 MG/2ML IJ SOLN
4.0000 mg | Freq: Once | INTRAMUSCULAR | Status: AC
  Administered 2018-03-29: 4 mg via INTRAVENOUS
  Filled 2018-03-29: qty 2

## 2018-03-29 NOTE — ED Notes (Signed)
UC sent to lab also

## 2018-03-29 NOTE — ED Notes (Signed)
Patient given discharge instructions and verbalized understanding.  Patient stable to discharge at this time.  Patient is alert and oriented to baseline.  No distressed noted at this time.  All belongings taken with the patient at discharge.   

## 2018-03-29 NOTE — ED Triage Notes (Signed)
Per Pt.  He was at home yesterday and had a syncopal event.  He does not know how long he was down for.  He remembers standing and then waking up on the floor with a swollen right ankle.  Pt is AOx4 NAD noted at this time.
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
## 2018-05-20 ENCOUNTER — Ambulatory Visit (HOSPITAL_COMMUNITY)
Admission: RE | Admit: 2018-05-20 | Discharge: 2018-05-20 | Disposition: A | Discharge status: Home / Self Care | Payer: Self-pay | Attending: Psychiatry | Admitting: Psychiatry

## 2018-05-20 DIAGNOSIS — F1414 Cocaine abuse with cocaine-induced mood disorder: Secondary | ICD-10-CM | POA: Insufficient documentation

## 2018-05-20 DIAGNOSIS — Z880 Allergy status to penicillin: Secondary | ICD-10-CM | POA: Insufficient documentation

## 2018-05-20 DIAGNOSIS — R45851 Suicidal ideations: Secondary | ICD-10-CM | POA: Insufficient documentation

## 2018-05-20 NOTE — H&P (Addendum)
Behavioral Health Medical Screening Exam  Noah Williams is an 33 y.o. male presents as walk in at [facility] accompanied by his girlfriend with complaints of substance abuse and seeking help.  Denies suicidal/self-harm/homicidal ideation, psychosis, and paranoia.  Denies prior psychiatric history.  Total Time spent with patient: 30 minutes  Psychiatric Specialty Exam: Physical Exam  Vitals reviewed. Constitutional: He is oriented to person, place, and time. He appears well-developed and well-nourished. No distress.  Neck: Normal range of motion. Neck supple.  Respiratory: Effort normal.  Musculoskeletal: Normal range of motion.  Neurological: He is alert and oriented to person, place, and time.  Skin: Skin is warm and dry.  Psychiatric: His speech is normal and behavior is normal. Judgment and thought content normal. His mood appears anxious. Cognition and memory are normal.    Review of Systems  Psychiatric/Behavioral: Positive for suicidal ideas (Cocaine, THC). Substance abuse: States that he does not want to kill himself but has thought that the world would be better without him sometimes; but not currently. The patient is nervous/anxious (Stable).   All other systems reviewed and are negative.   Blood pressure (!) 131/95, pulse (!) 108, temperature 98.7 F (37.1 C), resp. rate 18, SpO2 100 %.There is no height or weight on file to calculate BMI.  General Appearance: Casual  Eye Contact:  Good  Speech:  Clear and Coherent and Normal Rate  Volume:  Normal  Mood:  Anxious  Affect:  Congruent  Thought Process:  Coherent and Goal Directed  Orientation:  Full (Time, Place, and Person)  Thought Content:  WDL  Suicidal Thoughts:  No  Homicidal Thoughts:  No  Memory:  Immediate;   Good Recent;   Good Remote;   Good  Judgement:  Intact  Insight:  Good  Psychomotor Activity:  Normal  Concentration: Concentration: Good and Attention Span: Good  Recall:  Good  Fund of Knowledge:Good   Language: Good  Akathisia:  No  Handed:  Right  AIMS (if indicated):     Assets:  Communication Skills Desire for Improvement Housing Physical Health Social Support  Sleep:       Musculoskeletal: Strength & Muscle Tone: within normal limits Gait & Station: normal Patient leans: N/A  Blood pressure (!) 131/95, pulse (!) 108, temperature 98.7 F (37.1 C), resp. rate 18, SpO2 100 %.  Recommendations:  Outpatient psychiatric services; Drug/rehab services.  Resources given  Based on my evaluation the patient does not appear to have an emergency medical condition.   , NP 05/20/2018, 6:46 PM
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
## 2018-12-18 ENCOUNTER — Encounter (HOSPITAL_COMMUNITY): Payer: Self-pay

## 2018-12-18 ENCOUNTER — Emergency Department (HOSPITAL_COMMUNITY)
Admission: EM | Admit: 2018-12-18 | Discharge: 2018-12-18 | Disposition: A | Discharge status: Home / Self Care | Payer: PRIVATE HEALTH INSURANCE | Attending: Emergency Medicine | Admitting: Emergency Medicine

## 2018-12-18 ENCOUNTER — Other Ambulatory Visit: Payer: Self-pay

## 2018-12-18 DIAGNOSIS — Z79899 Other long term (current) drug therapy: Secondary | ICD-10-CM | POA: Diagnosis not present

## 2018-12-18 DIAGNOSIS — T50901A Poisoning by unspecified drugs, medicaments and biological substances, accidental (unintentional), initial encounter: Secondary | ICD-10-CM | POA: Insufficient documentation

## 2018-12-18 DIAGNOSIS — F17210 Nicotine dependence, cigarettes, uncomplicated: Secondary | ICD-10-CM | POA: Diagnosis not present

## 2018-12-18 DIAGNOSIS — T50904A Poisoning by unspecified drugs, medicaments and biological substances, undetermined, initial encounter: Secondary | ICD-10-CM | POA: Diagnosis present

## 2018-12-18 LAB — RAPID URINE DRUG SCREEN, HOSP PERFORMED
Amphetamines: NOT DETECTED
Barbiturates: NOT DETECTED
Benzodiazepines: NOT DETECTED
Cocaine: POSITIVE — AB
Opiates: POSITIVE — AB
Tetrahydrocannabinol: NOT DETECTED

## 2018-12-18 LAB — CBC WITH DIFFERENTIAL/PLATELET
Abs Immature Granulocytes: 0.1 10*3/uL — ABNORMAL HIGH (ref 0.00–0.07)
Basophils Absolute: 0.1 10*3/uL (ref 0.0–0.1)
Basophils Relative: 1 %
Eosinophils Absolute: 0.2 10*3/uL (ref 0.0–0.5)
Eosinophils Relative: 1 %
HCT: 51 % (ref 39.0–52.0)
Hemoglobin: 16.8 g/dL (ref 13.0–17.0)
Immature Granulocytes: 1 %
Lymphocytes Relative: 23 %
Lymphs Abs: 3.3 10*3/uL (ref 0.7–4.0)
MCH: 29.5 pg (ref 26.0–34.0)
MCHC: 32.9 g/dL (ref 30.0–36.0)
MCV: 89.5 fL (ref 80.0–100.0)
Monocytes Absolute: 0.8 10*3/uL (ref 0.1–1.0)
Monocytes Relative: 5 %
Neutro Abs: 10 10*3/uL — ABNORMAL HIGH (ref 1.7–7.7)
Neutrophils Relative %: 69 %
Platelets: 339 10*3/uL (ref 150–400)
RBC: 5.7 MIL/uL (ref 4.22–5.81)
RDW: 12.6 % (ref 11.5–15.5)
WBC: 14.4 10*3/uL — ABNORMAL HIGH (ref 4.0–10.5)
nRBC: 0 % (ref 0.0–0.2)

## 2018-12-18 LAB — COMPREHENSIVE METABOLIC PANEL
ALT: 17 U/L (ref 0–44)
AST: 16 U/L (ref 15–41)
Albumin: 3.6 g/dL (ref 3.5–5.0)
Alkaline Phosphatase: 62 U/L (ref 38–126)
Anion gap: 8 (ref 5–15)
BUN: 11 mg/dL (ref 6–20)
CO2: 20 mmol/L — ABNORMAL LOW (ref 22–32)
Calcium: 8.5 mg/dL — ABNORMAL LOW (ref 8.9–10.3)
Chloride: 109 mmol/L (ref 98–111)
Creatinine, Ser: 1.15 mg/dL (ref 0.61–1.24)
GFR calc Af Amer: >60 mL/min (ref >60)
GFR calc non Af Amer: >60 mL/min (ref >60)
Glucose, Bld: 185 mg/dL — ABNORMAL HIGH (ref 70–99)
Potassium: 4 mmol/L (ref 3.5–5.1)
Sodium: 137 mmol/L (ref 135–145)
Total Bilirubin: 0.5 mg/dL (ref 0.3–1.2)
Total Protein: 6.3 g/dL — ABNORMAL LOW (ref 6.5–8.1)

## 2018-12-18 LAB — SALICYLATE LEVEL: Salicylate Lvl: <7 mg/dL (ref 2.8–30.0)

## 2018-12-18 LAB — ACETAMINOPHEN LEVEL: Acetaminophen (Tylenol), Serum: <10 ug/mL — ABNORMAL LOW (ref 10–30)

## 2018-12-18 LAB — ETHANOL: Alcohol, Ethyl (B): <10 mg/dL (ref <10)

## 2018-12-18 MED ORDER — ONDANSETRON HCL 4 MG/2ML IJ SOLN
4.0000 mg | Freq: Once | INTRAMUSCULAR | Status: AC
  Administered 2018-12-18: 4 mg via INTRAVENOUS
  Filled 2018-12-18: qty 2

## 2018-12-18 MED ORDER — SODIUM CHLORIDE 0.9 % IV BOLUS
1000.0000 mL | Freq: Once | INTRAVENOUS | Status: AC
  Administered 2018-12-18: 1000 mL via INTRAVENOUS

## 2018-12-18 NOTE — Discharge Instructions (Signed)
Please refrain from using illicit drugs or prescription medication that do not belong to you. Follow-up with your primary care doctor. Return here for any new/acute changes.

## 2018-12-18 NOTE — ED Provider Notes (Signed)
[facility]-EMERGENCY DEPT Provider Note   CSN: [id number] Arrival date & time: 12/18/18  0012     History   Chief Complaint Chief Complaint  Patient presents with  . Drug Overdose    HPI Noah Williams is a 33 y.o. male.     The history is provided by the patient and medical records.  Drug Overdose     33 y.o. M here after an OD at the [facility] drive-through on [facility].  EMS was called by employee, upon EMS arrival he was apneic and unresponsive.  He was given 2mg  Narcan and became awake and alert.  He did began vomiting on arrival to the ED.  Patient adamantly denies taking any narcotics or using heroin today.  He denies any alcohol use.  Denies any significant PMH.  No past medical history on file.  There are no active problems to display for this patient.   No past surgical history on file.      Home Medications    Prior to Admission medications   Medication Sig Start Date End Date Taking? Authorizing Provider  naproxen sodium (ANAPROX) 220 MG tablet Take 440 mg by mouth 2 (two) times daily as needed (pain).    [provider]  oxyCODONE-acetaminophen (PERCOCET/ROXICET) 5-325 MG tablet Take 1 tablet by mouth every 4 (four) hours as needed for severe pain. 03/29/18   05/28/18, MD  traMADol (ULTRAM) 50 MG tablet Take 1 tablet (50 mg total) by mouth every 6 (six) hours as needed for moderate pain. 08/12/13   08/14/13, PA-C    Family History No family history on file.  Social History Social History   Tobacco Use  . Smoking status: Current Every Day Smoker    Packs/day: 1.00    Types: Cigarettes  . Smokeless tobacco: Never Used  Substance Use Topics  . Alcohol use: No  . Drug use: No     Allergies   Amoxicillin and Augmentin [amoxicillin-pot clavulanate]   Review of Systems Review of Systems  Constitutional:       OD  All other systems reviewed and are negative.    Physical Exam Updated Vital  Signs BP (!) 128/93   Pulse (!) 103   Temp 98.3 F (36.8 C) (Oral)   Resp 12   SpO2 97%   Physical Exam Vitals signs and nursing note reviewed.  Constitutional:      Appearance: He is well-developed.     Comments: Drowsy but arouses to voice, able to answer questions and follow commands Emesis present on shirt  HENT:     Head: Normocephalic and atraumatic.  Eyes:     Conjunctiva/sclera: Conjunctivae normal.     Pupils: Pupils are equal, round, and reactive to light.  Neck:     Musculoskeletal: Normal range of motion.  Cardiovascular:     Rate and Rhythm: Regular rhythm. Tachycardia present.     Heart sounds: Normal heart sounds.     Comments: Tachycardic to 118 during exam Pulmonary:     Effort: Pulmonary effort is normal.     Breath sounds: Normal breath sounds.  Abdominal:     General: Bowel sounds are normal.     Palpations: Abdomen is soft.  Musculoskeletal: Normal range of motion.  Skin:    General: Skin is warm and dry.  Neurological:     Mental Status: He is oriented to person, place, and time.      ED Treatments / Results  Labs (all labs  ordered are listed, but only abnormal results are displayed) Labs Reviewed  CBC WITH DIFFERENTIAL/PLATELET  COMPREHENSIVE METABOLIC PANEL  ETHANOL  RAPID URINE DRUG SCREEN, HOSP PERFORMED  SALICYLATE LEVEL  ACETAMINOPHEN LEVEL    EKG None  Radiology No results found.  Procedures Procedures (including critical care time)  Medications Ordered in ED Medications  sodium chloride 0.9 % bolus 1,000 mL (0 mLs Intravenous Stopped 12/18/18 0151)  ondansetron (ZOFRAN) injection 4 mg (4 mg Intravenous Given 12/18/18 0046)     Initial Impression / Assessment and Plan / ED Course  I have reviewed the triage vital signs and the nursing notes.  Pertinent labs & imaging results that were available during my care of the patient were reviewed by me and considered in my medical decision making (see chart for details).   33 year old male here after drug overdose in the [employer] drive-through.  He was initially apneic and unresponsive with EMS, given Narcan and became awake and alert.  He began vomiting on arrival to ED.  He has mild tachycardia but overall hemodynamically stable.  He adamantly denies any drug use tonight.  Plan for screening labs including drug screen.  Labs are grossly reassuring.  UDS is positive for cocaine and opiates.  Will monitor here until clinically awake and alert.  5:47 AM Patient awake and alert now.  He is drinking water.  He does now admit to using heroin but denies any attempt at self harm.  Denies SI/HI/AVH.  If tolerating PO, feel he will be stable for discharge.  6:11 AM Tolerating PO, continues feeling well.  Mom is on the way to get him.  Appears stable for discharge.  Advised against using illicit drugs or any prescription medications not belonging to him.  May return here for any new/acute changes.  Final Clinical Impressions(s) / ED Diagnoses   Final diagnoses:  Accidental drug overdose, initial encounter    ED Discharge Orders    None       Larene Pickett, PA-C 12/18/18 [place], Delice Bison, DO 12/18/18 [phone]

## 2018-12-18 NOTE — ED Triage Notes (Signed)
[employer] [facility]ms from [employer] drive [place]. Upon Ems arrival unresponsive and apneic.  EMS gave 2 Narcan which became alert and oriented. Pt complaining of N/V once arriving here.   Cbg 192  144/100 110 Sinus Tach

## 2019-02-02 ENCOUNTER — Ambulatory Visit (HOSPITAL_COMMUNITY)
Admission: RE | Admit: 2019-02-02 | Discharge: 2019-02-02 | Disposition: A | Discharge status: Home / Self Care | Payer: BLUE CROSS/BLUE SHIELD | Attending: Psychiatry | Admitting: Psychiatry

## 2019-02-02 DIAGNOSIS — F1414 Cocaine abuse with cocaine-induced mood disorder: Secondary | ICD-10-CM | POA: Insufficient documentation

## 2019-02-02 DIAGNOSIS — F331 Major depressive disorder, recurrent, moderate: Secondary | ICD-10-CM | POA: Insufficient documentation

## 2019-02-02 DIAGNOSIS — F1114 Opioid abuse with opioid-induced mood disorder: Secondary | ICD-10-CM | POA: Diagnosis present

## 2019-02-02 DIAGNOSIS — F17210 Nicotine dependence, cigarettes, uncomplicated: Secondary | ICD-10-CM | POA: Insufficient documentation

## 2019-02-02 NOTE — H&P (Signed)
Behavioral Health Medical Screening Exam  Noah Williams is an 33 y.o. male patient presents to [facility] as a walk-in with complaints of depression related to substance use issues with (heroin and cocaine).  Patient denies any suicidal thoughts at this current time but states he has had thoughts in the past.  Patient states that he is seeking services for substance use issues.  Patient reports that he lives in [place] with his fiancee and that he is currently unemployed at this time.  At this time patient denies suicidal/self-harm/homicidal ideations, psychosis, paranoia.  Total Time spent with patient: 30 minutes  Psychiatric Specialty Exam: Physical Exam  Vitals reviewed. Constitutional: No distress.  Respiratory: Effort normal.  Musculoskeletal:        General: Normal range of motion.     Cervical back: Normal range of motion.  Neurological: He is alert.  Skin: Skin is warm and dry.  Psychiatric: His speech is normal and behavior is normal. Thought content normal. Cognition and memory are normal. He expresses impulsivity. Depressed: Stable.    Review of Systems  Gastrointestinal: Diarrhea: denies.  Psychiatric/Behavioral: Confusion:  Denies. Hallucinations:  Denies. Sleep disturbance:  Denies. Suicidal ideas: Denies at this time; but states he has had before. Nervous/anxious:  Denies.        Patient states that he has a substance use issue with heroin and crack cocaine.  Reports last use was today.  Patient states that he needs to get help for his substance use issues and has tried on his arm but has not been able to get in anywhere.    Blood pressure (!) 156/109, pulse (!) 115, temperature 99.3 F (37.4 C), temperature source Oral, resp. rate 20, SpO2 98 %.There is no height or weight on file to calculate BMI.  General Appearance: Casual  Eye Contact:  Good  Speech:  Clear and Coherent and Normal Rate  Volume:  Normal  Mood:  "Depressed"    Affect:   Appropriate, Congruent and Depressed  Thought Process:  Coherent, Goal Directed and Descriptions of Associations: Intact  Orientation:  Full (Time, Place, and Person)  Thought Content:  WDL  Suicidal Thoughts:  No  Homicidal Thoughts:  No  Memory:  Immediate;   Good Recent;   Good  Judgement:  Intact  Insight:  Present  Psychomotor Activity:  Normal  Concentration: Concentration: Good and Attention Span: Good  Recall:  Good  Fund of Knowledge:Good  Language: Good  Akathisia:  No  Handed:  Right  AIMS (if indicated):     Assets:  Communication Skills Desire for Improvement Housing Social Support  Sleep:       Musculoskeletal: Strength & Muscle Tone: within normal limits Gait & Station: normal Patient leans: N/A  Blood pressure (!) 156/109, pulse (!) 115, temperature 99.3 F (37.4 C), temperature source Oral, resp. rate 20, SpO2 98 %.  Recommendations: Outpatient psychiatric services and referral/resources for substance use community services, rehab, and outpatient services.  Based on my evaluation the patient does not appear to have an emergency medical condition.   Disposition: Patient psychiatrically cleared No evidence of imminent risk to self or others at present.   Patient does not meet criteria for psychiatric inpatient admission. Supportive therapy provided about ongoing stressors. Discussed crisis plan, support from social network, calling 911, coming to the Emergency Department, and calling Suicide Hotline.  Shuvon Rankin, NP 02/02/2019, 6:20 PM

## 2019-02-02 NOTE — BH Assessment (Addendum)
Assessment Note  Noah Williams is an engaged 33 y.o. male who presents voluntarily to [facility] for a walk-in assessment.  Pt is reporting symptoms of depression and desire to stop using drugs. Pt reports using heroin and crack cocaine daily. He was seen for same 04/2018. Pt reports no current medication. He denies current suicidal ideation. Pt acknowledges multiple symptoms of Depression, including anhedonia, isolating, feelings of worthlessness & guilt, changes in sleep & appetite, & increased irritability. Pt denies homicidal ideation/ history of violence. Pt denies auditory & visual hallucinations & other symptoms of psychosis. Pt states current stressors include his drug use.   Pt lives with his fiance. Pt denies hx of abuse. Pt reports there is a family history of mental illness, his uncle has schizophrenia. Pt is not currently working or in school. Pt has fair insight and judgment. Pt's memory is intact. Legal history includes court date 03/2019 for DUI.  Protective factors against suicide include good family support, no current SI, no access to firearms, no current psychotic symptoms and no prior attempts.?  Pt's OP history includes only NA meetings. IP history includes none. .  ? MSE: Pt is disheveled, quiet-awake, oriented x4 with normal speech and normal motor behavior. Eye contact is fair. Pt's mood is depressed and affect is depressed and constricted. Affect is congruent with mood. Thought process is coherent and relevant. There is no indication Pt is currently responding to internal stimuli or experiencing delusional thought content. Pt was cooperative throughout assessment.   Disposition: Shuvon Rankin, NP recommends pt follow up with substance abuse tx program. Local inpt & outpt tx provider information given to pt. Pt to call [facility] this evening, they are expecting a phone call and take pt's insurance, BCBS.  Diagnosis: opioid abuse; cocaine abuse; MDD, recurrent, moderate  Past  Medical History: No past medical history on file.  No past surgical history on file.  Family History: No family history on file.  Social History:  reports that he has been smoking cigarettes. He has been smoking about 1.00 pack per day. He has never used smokeless tobacco. He reports that he does not drink alcohol or use drugs.  Additional Social History:  Alcohol / Drug Use Pain Medications: none Prescriptions: none reported Over the Counter: none History of alcohol / drug use?: Yes Substance #1 Name of Substance 1: heroin 1 - Age of First Use: 28 1 - Frequency: daily 1 - Last Use / Amount: this am Substance #2 Name of Substance 2: crack cocaine 2 - Age of First Use: 23 2 - Frequency: daily 2 - Last Use / Amount: 01/31/2019  CIWA: CIWA-Ar BP: (!) 156/109 Pulse Rate: (!) 115 COWS:    Allergies:  Allergies  Allergen Reactions  . Amoxicillin Rash  . Augmentin [Amoxicillin-Pot Clavulanate] Rash    Pt states he gets a bad rash.    Home Medications: (Not in a hospital admission)   OB/GYN Status:  No LMP for male patient.  General Assessment Data Location of Assessment: [facility] TTS Assessment: In system Is this a Tele or Face-to-Face Assessment?: Face-to-Face Is this an Initial Assessment or a Re-assessment for this encounter?: Initial Assessment Patient Accompanied by:: N/A Language Other than English: No Living Arrangements: Other (Comment) What gender do you identify as?: Male Marital status: Long term relationship Living Arrangements: Spouse/significant other Can pt return to current living arrangement?: Yes Admission Status: Voluntary Is patient capable of signing voluntary admission?: Yes Referral Source: Self/Family/Friend Insurance type: BCBS  Crisis Care Plan Living Arrangements: Spouse/significant other Name of Psychiatrist: none Name of Therapist: none  Education Status Is patient currently in school?: No Is the patient  employed, unemployed or receiving disability?: Unemployed  Risk to self with the past 6 months Suicidal Ideation: No Has patient been a risk to self within the past 6 months prior to admission? : No Suicidal Intent: No Has patient had any suicidal intent within the past 6 months prior to admission? : No Is patient at risk for suicide?: Yes Suicidal Plan?: No Has patient had any suicidal plan within the past 6 months prior to admission? : No What has been your use of drugs/alcohol within the last 12 months?: daily Previous Attempts/Gestures: No How many times?: 0 Other Self Harm Risks: male, substance abuse, depression sx Intentional Self Injurious Behavior: None Family Suicide History: No Recent stressful life event(s): Turmoil (Comment) Persecutory voices/beliefs?: No Depression: Yes Depression Symptoms: Despondent, Insomnia, Isolating, Fatigue, Guilt, Feeling angry/irritable, Feeling worthless/self pity, Loss of interest in usual pleasures Substance abuse history and/or treatment for substance abuse?: Yes Suicide prevention information given to non-admitted patients: Yes(& mobile crisis number)  Risk to Others within the past 6 months Homicidal Ideation: No Does patient have any lifetime risk of violence toward others beyond the six months prior to admission? : No Thoughts of Harm to Others: No Current Homicidal Intent: No Current Homicidal Plan: No History of harm to others?: No Assessment of Violence: None Noted Does patient have access to weapons?: No Criminal Charges Pending?: Yes Describe Pending Criminal Charges: DWI Does patient have a court date: Yes Court Date: 04/11/18 Is patient on probation?: No  Psychosis Hallucinations: None noted Delusions: None noted  Mental Status Report Appearance/Hygiene: Disheveled Eye Contact: Poor Motor Activity: Freedom of movement Speech: Logical/coherent Level of Consciousness: Quiet/awake Mood: Depressed Affect:  Depressed Anxiety Level: Minimal Thought Processes: Relevant, Coherent Judgement: Partial Orientation: Appropriate for developmental age Obsessive Compulsive Thoughts/Behaviors: None  Cognitive Functioning Concentration: Good Memory: Recent Intact, Remote Intact Is patient IDD: No Insight: Fair Impulse Control: Fair Appetite: Poor Have you had any weight changes? : No Change Sleep: Decreased Total Hours of Sleep: 5 Vegetative Symptoms: Decreased grooming, Staying in bed  ADLScreening [facility]) Patient's cognitive ability adequate to safely complete daily activities?: Yes Patient able to express need for assistance with ADLs?: Yes Independently performs ADLs?: Yes (appropriate for developmental age)  Prior Inpatient Therapy Prior Inpatient Therapy: No  Prior Outpatient Therapy Prior Outpatient Therapy: No Does patient have an ACCT team?: No Does patient have Intensive In-House Services?  : No Does patient have [facility] services? : No Does patient have P4CC services?: No  ADL Screening (condition at time of admission) Patient's cognitive ability adequate to safely complete daily activities?: Yes Is the patient deaf or have difficulty hearing?: No Does the patient have difficulty concentrating, remembering, or making decisions?: No Patient able to express need for assistance with ADLs?: Yes Does the patient have difficulty dressing or bathing?: No Independently performs ADLs?: Yes (appropriate for developmental age) Does the patient have difficulty walking or climbing stairs?: No Weakness of Legs: None Weakness of Arms/Hands: None  Home Assistive Devices/Equipment Home Assistive Devices/Equipment: None  Therapy Consults (therapy consults require a physician order) PT Evaluation Needed: No OT Evalulation Needed: No SLP Evaluation Needed: No Abuse/Neglect Assessment (Assessment to be complete while patient is alone) Abuse/Neglect Assessment Can Be  Completed: Yes Physical Abuse: Denies Verbal Abuse: Denies Sexual Abuse: Denies Exploitation of patient/patient's resources: Denies Self-Neglect: Denies  Values / Beliefs Cultural Requests During Hospitalization: None Spiritual Requests During Hospitalization: None Consults Spiritual Care Consult Needed: No Transition of Care Team Consult Needed: No Advance Directives (For Healthcare) Does Patient Have a Medical Advance Directive?: No Would patient like information on creating a medical advance directive?: No - Patient declined          Disposition: Shuvon Rankin, NP recommends pt follow up with substance abuse tx program. Local inpt & outpt tx provider information given to pt.  Disposition Initial Assessment Completed for this Encounter: Yes Disposition of Patient: Discharge  On Site Evaluation by:   Reviewed with Physician:    Richardean Chimera 02/02/2019 6:27 PM

## 2019-12-03 ENCOUNTER — Emergency Department (HOSPITAL_COMMUNITY): Payer: PRIVATE HEALTH INSURANCE

## 2019-12-03 ENCOUNTER — Other Ambulatory Visit: Payer: Self-pay

## 2019-12-03 ENCOUNTER — Encounter (HOSPITAL_COMMUNITY): Payer: Self-pay | Admitting: Emergency Medicine

## 2019-12-03 ENCOUNTER — Emergency Department (HOSPITAL_COMMUNITY)
Admission: EM | Admit: 2019-12-03 | Discharge: 2019-12-04 | Disposition: A | Discharge status: Home / Self Care | Payer: PRIVATE HEALTH INSURANCE | Attending: Emergency Medicine | Admitting: Emergency Medicine

## 2019-12-03 DIAGNOSIS — Z5321 Procedure and treatment not carried out due to patient leaving prior to being seen by health care provider: Secondary | ICD-10-CM | POA: Insufficient documentation

## 2019-12-03 DIAGNOSIS — H538 Other visual disturbances: Secondary | ICD-10-CM | POA: Insufficient documentation

## 2019-12-03 DIAGNOSIS — R42 Dizziness and giddiness: Secondary | ICD-10-CM | POA: Insufficient documentation

## 2019-12-03 DIAGNOSIS — R519 Headache, unspecified: Secondary | ICD-10-CM | POA: Insufficient documentation
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
## 2019-12-03 NOTE — ED Notes (Signed)
LWBS 

## 2019-12-07 ENCOUNTER — Encounter (HOSPITAL_COMMUNITY): Payer: Self-pay | Admitting: Emergency Medicine

## 2019-12-07 ENCOUNTER — Other Ambulatory Visit: Payer: Self-pay

## 2019-12-07 DIAGNOSIS — T50901A Poisoning by unspecified drugs, medicaments and biological substances, accidental (unintentional), initial encounter: Secondary | ICD-10-CM | POA: Insufficient documentation

## 2019-12-07 DIAGNOSIS — Z5321 Procedure and treatment not carried out due to patient leaving prior to being seen by health care provider: Secondary | ICD-10-CM | POA: Insufficient documentation

## 2019-12-07 NOTE — ED Triage Notes (Signed)
Patient states he took a goody powder but now he think it was something else. 170/110 96- pulse rr-120 100-room air. Patient brought in by [facility] from a hotel at [employer].

## 2019-12-08 ENCOUNTER — Emergency Department (HOSPITAL_COMMUNITY)
Admission: EM | Admit: 2019-12-08 | Discharge: 2019-12-08 | Disposition: A | Discharge status: Home / Self Care | Payer: Self-pay | Attending: Emergency Medicine | Admitting: Emergency Medicine

## 2020-05-03 ENCOUNTER — Emergency Department (HOSPITAL_COMMUNITY)
Admission: EM | Admit: 2020-05-03 | Discharge: 2020-05-03 | Disposition: A | Discharge status: Home / Self Care | Payer: Self-pay | Attending: Emergency Medicine | Admitting: Emergency Medicine

## 2020-05-03 ENCOUNTER — Other Ambulatory Visit: Payer: Self-pay

## 2020-05-03 ENCOUNTER — Encounter (HOSPITAL_COMMUNITY): Payer: Self-pay

## 2020-05-03 DIAGNOSIS — Y92009 Unspecified place in unspecified non-institutional (private) residence as the place of occurrence of the external cause: Secondary | ICD-10-CM | POA: Insufficient documentation

## 2020-05-03 DIAGNOSIS — Z7982 Long term (current) use of aspirin: Secondary | ICD-10-CM | POA: Insufficient documentation

## 2020-05-03 DIAGNOSIS — T401X1A Poisoning by heroin, accidental (unintentional), initial encounter: Secondary | ICD-10-CM | POA: Insufficient documentation

## 2020-05-03 DIAGNOSIS — F17210 Nicotine dependence, cigarettes, uncomplicated: Secondary | ICD-10-CM | POA: Insufficient documentation

## 2020-05-03 DIAGNOSIS — X58XXXA Exposure to other specified factors, initial encounter: Secondary | ICD-10-CM | POA: Insufficient documentation

## 2020-05-03 MED ORDER — ONDANSETRON HCL 4 MG/2ML IJ SOLN
4.0000 mg | Freq: Once | INTRAMUSCULAR | Status: AC
  Administered 2020-05-03: 4 mg via INTRAVENOUS
  Filled 2020-05-03: qty 2

## 2020-05-03 MED ORDER — NALOXONE HCL 4 MG/0.1ML NA LIQD: 1.0000 | Freq: Once | NASAL | Status: DC | PRN

## 2020-05-03 NOTE — ED Provider Notes (Signed)
[facility]-EMERGENCY DEPT Provider Note   CSN: [id number] Arrival date & time: 05/03/20  1935     History Chief Complaint  Patient presents with  . Drug Overdose    Noah Williams is a 35 y.o. male.  HPI Patient presents after heroin overdose.  States he snorted heroin and was found unresponsive.  Had been sober for couple years but relapsed today.  Narcan given by EMS both IM and then IV.  Patient is somewhat upset about the events today.  Denies other use.  States he thinks it was here when he was using.    History reviewed. No pertinent past medical history.  There are no problems to display for this patient.   History reviewed. No pertinent surgical history.     History reviewed. No pertinent family history.  Social History   Tobacco Use  . Smoking status: Current Every Day Smoker    Packs/day: 1.00    Types: Cigarettes  . Smokeless tobacco: Never Used  Substance Use Topics  . Alcohol use: No  . Drug use: No    Home Medications Prior to Admission medications   Medication Sig Start Date End Date Taking? Authorizing Provider  Aspirin-Acetaminophen (GOODYS BODY PAIN PO) Take 1 Package by mouth daily as needed.    [provider]  naproxen sodium (ANAPROX) 220 MG tablet Take 440 mg by mouth 2 (two) times daily as needed (pain).    [provider]  oxyCODONE-acetaminophen (PERCOCET/ROXICET) 5-325 MG tablet Take 1 tablet by mouth every 4 (four) hours as needed for severe pain. 03/29/18   Jacalyn Lefevre, MD  traMADol (ULTRAM) 50 MG tablet Take 1 tablet (50 mg total) by mouth every 6 (six) hours as needed for moderate pain. Patient not taking: Reported on 12/08/2019 08/12/13   Fayrene Helper, PA-C    Allergies    Amoxicillin and Augmentin [amoxicillin-pot clavulanate]  Review of Systems   Review of Systems  Constitutional: Negative for appetite change.  HENT: Negative for congestion.   Respiratory: Negative for shortness of  breath.   Gastrointestinal: Positive for nausea.  Genitourinary: Negative for dysuria.  Musculoskeletal: Negative for back pain.  Skin: Negative for wound.  Neurological: Positive for syncope. Negative for weakness.  Psychiatric/Behavioral: Negative for confusion.    Physical Exam Updated Vital Signs BP (!) 132/96   Pulse 77   Temp 98.6 F (37 C) (Oral)   Resp 17   Ht 6\' 2"  (1.88 m)   Wt 124.7 kg   SpO2 100%   BMI 35.31 kg/m   Physical Exam Vitals and nursing note reviewed.  HENT:     Head: Normocephalic and atraumatic.  Eyes:     Pupils: Pupils are equal, round, and reactive to light.  Cardiovascular:     Rate and Rhythm: Regular rhythm.  Pulmonary:     Breath sounds: No wheezing.  Abdominal:     Tenderness: There is no abdominal tenderness.  Musculoskeletal:        General: No tenderness.     Cervical back: Neck supple.  Skin:    General: Skin is warm.     Capillary Refill: Capillary refill takes less than 2 seconds.  Neurological:     Mental Status: He is alert and oriented to person, place, and time.  Psychiatric:     Comments: Patient is somewhat upset about the overdose.     ED Results / Procedures / Treatments   Labs (all labs ordered are listed, but only abnormal results  are displayed) Labs Reviewed - No data to display  EKG None  Radiology No results found.  Procedures Procedures   Medications Ordered in ED Medications  naloxone (NARCAN) nasal spray 4 mg/0.1 mL (has no administration in time range)  ondansetron (ZOFRAN) injection 4 mg (4 mg Intravenous Given 05/03/20 1958)    ED Course  I have reviewed the triage vital signs and the nursing notes.  Pertinent labs & imaging results that were available during my care of the patient were reviewed by me and considered in my medical decision making (see chart for details).    MDM Rules/Calculators/A&P                          Patient presents after heroin overdose.  Had not used in a long  time.  Snorted.  Has been stable during monitoring in the ER.  Had been given Narcan prehospital.  Do not feel he needs further work-up for the admitted heroin overdose.  Zofran given here for his nausea.  Given a nasal Narcan for home for risk reduction.  Resources given for follow-up.  Discharge home.  Does not appear to be at risk for decompensation from this overdose. Final Clinical Impression(s) / ED Diagnoses Final diagnoses:  Accidental overdose of heroin, initial encounter [facility])    Rx / DC Orders ED Discharge Orders    None       Benjiman Core, MD 05/03/20 2306
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
## 2020-05-19 ENCOUNTER — Encounter (HOSPITAL_COMMUNITY): Payer: Self-pay

## 2020-05-19 ENCOUNTER — Emergency Department (HOSPITAL_COMMUNITY)
Admission: EM | Admit: 2020-05-19 | Discharge: 2020-05-19 | Disposition: A | Discharge status: Home / Self Care | Payer: Self-pay | Attending: Emergency Medicine | Admitting: Emergency Medicine

## 2020-05-19 ENCOUNTER — Other Ambulatory Visit: Payer: Self-pay

## 2020-05-19 DIAGNOSIS — S0081XA Abrasion of other part of head, initial encounter: Secondary | ICD-10-CM | POA: Insufficient documentation

## 2020-05-19 DIAGNOSIS — X58XXXA Exposure to other specified factors, initial encounter: Secondary | ICD-10-CM | POA: Insufficient documentation

## 2020-05-19 DIAGNOSIS — F17210 Nicotine dependence, cigarettes, uncomplicated: Secondary | ICD-10-CM | POA: Insufficient documentation

## 2020-05-19 DIAGNOSIS — T401X1A Poisoning by heroin, accidental (unintentional), initial encounter: Secondary | ICD-10-CM | POA: Insufficient documentation

## 2020-05-19 MED ORDER — NALOXONE HCL 0.4 MG/ML IJ SOLN
0.4000 mg | Freq: Once | INTRAMUSCULAR | Status: AC
  Administered 2020-05-19: 0.4 mg via INTRAVENOUS
  Filled 2020-05-19: qty 1

## 2020-05-19 NOTE — ED Triage Notes (Signed)
Patient BIB [facility] found unresponsive in front of apartment mailboxes after snorting heroin. Fire department arrived and patient was apneic and blue. Pinpoint pupils, they gave 2mg  Narcan. After 10 minutes, patient woke up in route to ER. Patient has pain over right eye. Minor abrasion on top right forehead. Patient said he took heroin around 4pm and remembers walking towards mailbox, sat down and then blacked out.   CBG 151 BP 156/100 RR 24 O2 100% room air HR 100

## 2020-05-19 NOTE — ED Notes (Signed)
Patient requesting a dose of Narcan. Patient becoming more drowsy and respirations 7 times a minute. Dr. Charm Barges notified and awaiting order for Narcan.

## 2020-05-19 NOTE — ED Provider Notes (Signed)
[name] Provider Note   CSN: [id number] Arrival date & time: 05/19/20  1921     History Chief Complaint  Patient presents with  . Drug Overdose    Noah Williams is a 35 y.o. male.  He has no significant past medical history.  He was found unresponsive in front of the mailboxes.  Apparently was apneic and cyanotic by fire.  They gave 2 mg of Narcan.  Patient woke up during transport.  He admits to snorting heroin.  He says he is overdosed before.  This was accidental not intentional.  The history is provided by the patient.  Drug Overdose This is a recurrent problem. The current episode started less than 1 hour ago. The problem has been gradually improving. Pertinent negatives include no chest pain, no abdominal pain, no headaches and no shortness of breath. Nothing aggravates the symptoms. Relieved by: narcan. The treatment provided moderate relief.       History reviewed. No pertinent past medical history.  There are no problems to display for this patient.   History reviewed. No pertinent surgical history.     History reviewed. No pertinent family history.  Social History   Tobacco Use  . Smoking status: Current Every Day Smoker    Packs/day: 1.00    Types: Cigarettes  . Smokeless tobacco: Never Used  Substance Use Topics  . Alcohol use: No  . Drug use: No    Home Medications Prior to Admission medications   Medication Sig Start Date End Date Taking? Authorizing Provider  Aspirin-Acetaminophen (GOODYS BODY PAIN PO) Take 1 Package by mouth daily as needed.    [provider]  naproxen sodium (ANAPROX) 220 MG tablet Take 440 mg by mouth 2 (two) times daily as needed (pain).    [provider]  oxyCODONE-acetaminophen (PERCOCET/ROXICET) 5-325 MG tablet Take 1 tablet by mouth every 4 (four) hours as needed for severe pain. 03/29/18   Jacalyn Lefevre, MD  traMADol (ULTRAM) 50 MG tablet Take 1 tablet (50 mg  total) by mouth every 6 (six) hours as needed for moderate pain. Patient not taking: Reported on 12/08/2019 08/12/13   Fayrene Helper, PA-C    Allergies    Amoxicillin and Augmentin [amoxicillin-pot clavulanate]  Review of Systems   Review of Systems  Constitutional: Negative for fever.  HENT: Negative for sore throat.   Eyes: Negative for visual disturbance.  Respiratory: Negative for shortness of breath.   Cardiovascular: Negative for chest pain.  Gastrointestinal: Negative for abdominal pain.  Genitourinary: Negative for dysuria.  Musculoskeletal: Negative for neck pain.  Skin: Positive for wound (abrasion forehead). Negative for rash.  Neurological: Negative for headaches.    Physical Exam Updated Vital Signs BP (!) 138/91 (BP Location: Right Arm)   Pulse 69   Temp 98 F (36.7 C) (Oral)   Resp 14   SpO2 98%   Physical Exam Vitals and nursing note reviewed.  Constitutional:      Appearance: Normal appearance. He is well-developed.  HENT:     Head: Normocephalic.     Comments: Small abrasion forehead Eyes:     Conjunctiva/sclera: Conjunctivae normal.  Cardiovascular:     Rate and Rhythm: Normal rate and regular rhythm.     Heart sounds: No murmur heard.   Pulmonary:     Effort: Pulmonary effort is normal. No respiratory distress.     Breath sounds: Normal breath sounds.  Abdominal:     Palpations: Abdomen is soft.  Tenderness: There is no abdominal tenderness.  Musculoskeletal:        General: No deformity or signs of injury. Normal range of motion.     Cervical back: Neck supple.  Skin:    General: Skin is warm and dry.  Neurological:     General: No focal deficit present.     Mental Status: He is alert.     ED Results / Procedures / Treatments   Labs (all labs ordered are listed, but only abnormal results are displayed) Labs Reviewed - No data to display  EKG None  Radiology No results found.  Procedures Procedures   Medications Ordered in  ED Medications  naloxone [facility]) injection 0.4 mg (0.4 mg Intravenous Given 05/19/20 2038)    ED Course  I have reviewed the triage vital signs and the nursing notes.  Pertinent labs & imaging results that were available during my care of the patient were reviewed by me and considered in my medical decision making (see chart for details).  Clinical Course as of 05/20/20 1005  Thu May 19, 2020  2107 Reassessed patient after dose of Narcan.  He is awake alert satting 100% on room air. [MB]  2216 Patient has been observed over 3 hours and looks well.  He asked for a dose of Narcan early and since then has been awake and alert with no desaturations or decline in his respiratory rate.  I think he is appropriate for discharge now.  Will give resources for substance abuse. [MB]    Clinical Course User Index [MB] Terrilee Files, MD   MDM Rules/Calculators/A&P                         Differential diagnosis includes heroin overdose, respiratory depression, respiratory failure, pulmonary edema, aspiration  Final Clinical Impression(s) / ED Diagnoses Final diagnoses:  Accidental overdose of heroin, initial encounter [facility])    Rx / DC Orders ED Discharge Orders    None       Terrilee Files, MD 05/20/20 1006

## 2020-05-19 NOTE — ED Notes (Signed)
Dr. Charm Barges gave permission for patient to be able to drink water. Patient alert and is responsive.

## 2020-05-19 NOTE — ED Notes (Signed)
Patient alert and oriented x4. Patient has been able to stay awake and respirations have been 14 times a minute for the past 30 minutes. Patient states he feels "a lot better after the second dose of Narcan."
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
